# Patient Record
Sex: Male | Born: 1970 | Race: Black or African American | Hispanic: No | Marital: Married | Smoking: Never smoker
Health system: Southern US, Community
[De-identification: ages and names within clinical notes are randomized; demographics above are authoritative.]

## PROBLEM LIST (undated history)

## (undated) DIAGNOSIS — I10 Essential (primary) hypertension: Secondary | ICD-10-CM

---

## 2014-07-14 ENCOUNTER — Emergency Department (HOSPITAL_BASED_OUTPATIENT_CLINIC_OR_DEPARTMENT_OTHER)
Admission: EM | Admit: 2014-07-14 | Discharge: 2014-07-14 | Disposition: A | Discharge status: Home / Self Care | Payer: Worker's Compensation | Attending: Emergency Medicine | Admitting: Emergency Medicine

## 2014-07-14 ENCOUNTER — Emergency Department (HOSPITAL_BASED_OUTPATIENT_CLINIC_OR_DEPARTMENT_OTHER): Payer: Worker's Compensation

## 2014-07-14 ENCOUNTER — Encounter (HOSPITAL_BASED_OUTPATIENT_CLINIC_OR_DEPARTMENT_OTHER): Payer: Self-pay | Admitting: *Deleted

## 2014-07-14 DIAGNOSIS — Y990 Civilian activity done for income or pay: Secondary | ICD-10-CM | POA: Insufficient documentation

## 2014-07-14 DIAGNOSIS — L089 Local infection of the skin and subcutaneous tissue, unspecified: Secondary | ICD-10-CM | POA: Insufficient documentation

## 2014-07-14 DIAGNOSIS — Z79899 Other long term (current) drug therapy: Secondary | ICD-10-CM | POA: Insufficient documentation

## 2014-07-14 DIAGNOSIS — I10 Essential (primary) hypertension: Secondary | ICD-10-CM | POA: Diagnosis not present

## 2014-07-14 DIAGNOSIS — Y9289 Other specified places as the place of occurrence of the external cause: Secondary | ICD-10-CM | POA: Diagnosis not present

## 2014-07-14 DIAGNOSIS — S93402A Sprain of unspecified ligament of left ankle, initial encounter: Secondary | ICD-10-CM | POA: Diagnosis not present

## 2014-07-14 DIAGNOSIS — S91012A Laceration without foreign body, left ankle, initial encounter: Secondary | ICD-10-CM | POA: Insufficient documentation

## 2014-07-14 DIAGNOSIS — Y9389 Activity, other specified: Secondary | ICD-10-CM | POA: Insufficient documentation

## 2014-07-14 DIAGNOSIS — S99912A Unspecified injury of left ankle, initial encounter: Secondary | ICD-10-CM | POA: Diagnosis present

## 2014-07-14 DIAGNOSIS — W1842XA Slipping, tripping and stumbling without falling due to stepping into hole or opening, initial encounter: Secondary | ICD-10-CM | POA: Insufficient documentation

## 2014-07-14 DIAGNOSIS — S90512A Abrasion, left ankle, initial encounter: Secondary | ICD-10-CM | POA: Diagnosis not present

## 2014-07-14 HISTORY — DX: Essential (primary) hypertension: I10

## 2014-07-14 NOTE — ED Notes (Signed)
Patient transported to X-ray via wheelchair per tech. 

## 2014-07-14 NOTE — ED Notes (Signed)
Left ankle injury. He stepped into a hole at work. workmans comp.

## 2014-07-14 NOTE — ED Provider Notes (Signed)
CSN: [id number]     Arrival date & time 07/14/14  1946 History   First MD Initiated Contact with Patient 07/14/14 1959     Chief Complaint  Patient presents with  . Ankle Injury     (Consider location/radiation/quality/duration/timing/severity/associated sxs/prior Treatment) HPI Comments: Patient presents with chief complaint of left ankle injury which occurred approximately 3 PM today. Patient stepped into a crevice between the back of the truck and an unloading ramp. He twisted his ankle and sustained an abrasion through his sock. Patient was able to bear weight after the injury however with a limp. No treatments prior to arrival. Patient denies knee or hip injury. Onset acute. Course is constant. Pain is worse with walking. Nothing makes it better.  Patient is a 44 y.o. male presenting with lower extremity injury. The history is provided by the patient.  Ankle Injury Associated symptoms include arthralgias and joint swelling. Pertinent negatives include no neck pain, numbness or weakness.    Past Medical History  Diagnosis Date  . Hypertension    History reviewed. No pertinent past surgical history. No family history on file. History  Substance Use Topics  . Smoking status: Never Smoker   . Smokeless tobacco: Not on file  . Alcohol Use: No    Review of Systems  Constitutional: Negative for activity change.  Musculoskeletal: Positive for joint swelling, arthralgias and gait problem. Negative for back pain and neck pain.  Skin: Negative for wound.  Neurological: Negative for weakness and numbness.      Allergies  Review of patient's allergies indicates no known allergies.  Home Medications   Prior to Admission medications   Medication Sig Start Date End Date Taking? Authorizing Provider  Carvedilol (COREG PO) Take by mouth.   Yes Historical Provider, MD  LISINOPRIL PO Take by mouth.   Yes Historical Provider, MD   BP 148/97 mmHg  Pulse 90  Temp(Src) 98 F (36.7 C)  (Oral)  Resp 20  Ht 5\' 11"  (1.803 m)  Wt 270 lb (122.471 kg)  BMI 37.67 kg/m2  SpO2 99% Physical Exam  Constitutional: He appears well-developed and well-nourished.  HENT:  Head: Normocephalic and atraumatic.  Eyes: Conjunctivae are normal.  Neck: Normal range of motion. Neck supple.  Cardiovascular:  Pulses:      Dorsalis pedis pulses are 2+ on the right side, and 2+ on the left side.       Posterior tibial pulses are 2+ on the right side, and 2+ on the left side.  Pulmonary/Chest: No respiratory distress.  Musculoskeletal: He exhibits edema and tenderness.       Left hip: Normal.       Left knee: Normal.       Left ankle: He exhibits laceration (abrasion). He exhibits normal range of motion and no swelling. Tenderness. Lateral malleolus tenderness found. No posterior TFL, no head of 5th metatarsal and no proximal fibula tenderness found.       Left foot: Normal.  Patient complains of pain with palpation of the lateral left ankle. He denies pain with palpation over the fibular head of the affected side. He denies pain in the hip of the affected side.  Neurological: He is alert.  Distal motor, sensation, and vascular intact.  Skin: Skin is warm and dry.  1 cm circular, clean abrasion overlying the lateral malleolus of left ankle.  Psychiatric: He has a normal mood and affect.  Vitals reviewed.   ED Course  Procedures (including critical care time) Labs Review Labs  Reviewed - No data to display  Imaging Review Dg Ankle Complete Left  07/14/2014   CLINICAL DATA:  Acute left ankle pain after injury stepping on piece of rubber today. Initial encounter.  EXAM: LEFT ANKLE COMPLETE - 3+ VIEW  COMPARISON:  None.  FINDINGS: There is no evidence of fracture, dislocation, or joint effusion. There is no evidence of arthropathy or other focal bone abnormality. Soft tissue swelling is noted over lateral malleolus.  IMPRESSION: No fracture or dislocation is noted. Soft tissue swelling is seen  over lateral malleolus suggesting ligamentous injury.   Electronically Signed   By: Lupita Raider, M.D.   On: 07/14/2014 20:20     EKG Interpretation None       8:52 PM Patient seen and examined.   Vital signs reviewed and are as follows: BP 148/97 mmHg  Pulse 90  Temp(Src) 98 F (36.7 C) (Oral)  Resp 20  Ht  (1.803 m)  Wt 270 lb (122.471 kg)  BMI 37.67 kg/m2  SpO2 99%  Patient provided with wound care and ASO. Counseled on signs and symptoms of infection that should cause him to return. Counseled on rice protocol and NSAIDs. Patient will rest ankle the weekend. He is instructed to follow-up with a PCP if he continues to have pain or difficulty walking in one week.  MDM   Final diagnoses:  Ankle sprain, left, initial encounter  Abrasion of ankle with infection, left, initial encounter   Patient with ankle injury. Ankle is neurovascularly intact. X-rays negative. Conservative measures indicated with PCP follow-up as instructed.    Renne Crigler, PA-C 07/14/14 2119  Linwood Dibbles, MD 07/15/14 [phone]

## 2014-07-14 NOTE — Discharge Instructions (Signed)
Please read and follow all provided instructions.  Your diagnoses today include:  1. Ankle sprain, left, initial encounter   2. Abrasion of ankle with infection, left, initial encounter     Tests performed today include:  An x-ray of your ankle - does NOT show any broken bones  Vital signs. See below for your results today.   Medications prescribed:   Naproxen - anti-inflammatory pain medication  Do not exceed 500mg  naproxen every 12 hours, take with food  You have been prescribed an anti-inflammatory medication or NSAID. Take with food. Take smallest effective dose for the shortest duration needed for your pain. Stop taking if you experience stomach pain or vomiting.   Take any prescribed medications only as directed.  Home care instructions:   Follow any educational materials contained in this packet  Follow R.I.C.E. Protocol:  R - rest your injury   I  - use ice on injury without applying directly to skin  C - compress injury with bandage or splint  E - elevate the injury as much as possible  Follow-up instructions: Please follow-up with your primary care provider if you continue to have significant pain or trouble walking in 1 week. In this case you may have a severe sprain that requires further care.   Return instructions:   Please return if your toes are numb or tingling, appear gray or blue, or you have severe pain (also elevate leg and loosen splint or wrap)  Please return to the Emergency Department if you experience worsening symptoms.   Please return if you have any other emergent concerns.  Additional Information:  Your vital signs today were: BP 148/97 mmHg   Pulse 90   Temp(Src) 98 F (36.7 C) (Oral)   Resp 20   Ht 5\' 11"  (1.803 m)   Wt 270 lb (122.471 kg)   BMI 37.67 kg/m2   SpO2 99% If your blood pressure (BP) was elevated above 135/85 this visit, please have this repeated by your doctor within one month. -------------- Your caregiver has  diagnosed you as suffering from an ankle sprain. Ankle sprain occurs when the ligaments that hold the ankle joint together are stretched or torn. It may take 4 to 6 weeks to heal.  For Activity: If prescribed crutches, use crutches with non-weight bearing for the first few days. Then, you may walk on your ankle as the pain allows, or as instructed. Start gradually with weight bearing on the affected ankle. Once you can walk pain free, then try jogging. When you can run forwards, then you can try moving side-to-side. If you cannot walk without crutches in one week, you need a re-check. --------------

## 2014-07-17 ENCOUNTER — Emergency Department (HOSPITAL_BASED_OUTPATIENT_CLINIC_OR_DEPARTMENT_OTHER): Payer: Worker's Compensation

## 2014-07-17 ENCOUNTER — Emergency Department (HOSPITAL_BASED_OUTPATIENT_CLINIC_OR_DEPARTMENT_OTHER)
Admission: EM | Admit: 2014-07-17 | Discharge: 2014-07-18 | Disposition: A | Discharge status: Home / Self Care | Payer: Worker's Compensation | Attending: Emergency Medicine | Admitting: Emergency Medicine

## 2014-07-17 ENCOUNTER — Encounter (HOSPITAL_BASED_OUTPATIENT_CLINIC_OR_DEPARTMENT_OTHER): Payer: Self-pay | Admitting: *Deleted

## 2014-07-17 DIAGNOSIS — T148XXA Other injury of unspecified body region, initial encounter: Secondary | ICD-10-CM

## 2014-07-17 DIAGNOSIS — I10 Essential (primary) hypertension: Secondary | ICD-10-CM | POA: Insufficient documentation

## 2014-07-17 DIAGNOSIS — Z79899 Other long term (current) drug therapy: Secondary | ICD-10-CM | POA: Insufficient documentation

## 2014-07-17 DIAGNOSIS — Y9389 Activity, other specified: Secondary | ICD-10-CM | POA: Diagnosis not present

## 2014-07-17 DIAGNOSIS — S50312A Abrasion of left elbow, initial encounter: Secondary | ICD-10-CM | POA: Insufficient documentation

## 2014-07-17 DIAGNOSIS — W1789XA Other fall from one level to another, initial encounter: Secondary | ICD-10-CM | POA: Diagnosis not present

## 2014-07-17 DIAGNOSIS — Y9289 Other specified places as the place of occurrence of the external cause: Secondary | ICD-10-CM | POA: Diagnosis not present

## 2014-07-17 DIAGNOSIS — Y990 Civilian activity done for income or pay: Secondary | ICD-10-CM | POA: Insufficient documentation

## 2014-07-17 DIAGNOSIS — S50812A Abrasion of left forearm, initial encounter: Secondary | ICD-10-CM | POA: Diagnosis not present

## 2014-07-17 DIAGNOSIS — S59912A Unspecified injury of left forearm, initial encounter: Secondary | ICD-10-CM | POA: Diagnosis present

## 2014-07-17 MED ORDER — IBUPROFEN 800 MG PO TABS
800.0000 mg | ORAL_TABLET | Freq: Once | ORAL | Status: AC
  Administered 2014-07-17: 800 mg via ORAL
  Filled 2014-07-17: qty 1

## 2014-07-17 NOTE — ED Notes (Signed)
Larey Seat 4' off the back of a truck at work. workmans comp. Injury to his left lower leg and left forearm/elbow. Abrasion to his arm. Bleeding controlled.

## 2014-07-17 NOTE — ED Provider Notes (Signed)
CSN: [id number]     Arrival date & time 07/17/14  2010 History  This chart was scribed for Syrus Nakama, MD by Octavia Heir, ED Scribe. This patient was seen in room MH07/MH07 and the patient's care was started at 11:42 PM.    Chief Complaint  Patient presents with  . Fall      Patient is a 44 y.o. male presenting with fall. The history is provided by the patient. No language interpreter was used.  Fall This is a new problem. The current episode started 6 to 12 hours ago. The problem occurs constantly. The problem has not changed since onset.Pertinent negatives include no chest pain, no abdominal pain, no headaches and no shortness of breath. Nothing aggravates the symptoms. Nothing relieves the symptoms. He has tried nothing for the symptoms.    HPI Comments: Nathan Travis is a 44 y.o. male who presents to the Emergency Department complaining of a fall that occurred about 7 hours ago. Pt notes having  left forearm and elbow. Pt denies LOC and head injury. No weakness or numbness continued to work.  FROM  Past Medical History  Diagnosis Date  . Hypertension    History reviewed. No pertinent past surgical history. No family history on file. History  Substance Use Topics  . Smoking status: Never Smoker   . Smokeless tobacco: Not on file  . Alcohol Use: No    Review of Systems  Respiratory: Negative for shortness of breath.   Cardiovascular: Negative for chest pain.  Gastrointestinal: Negative for abdominal pain.  Musculoskeletal: Positive for arthralgias. Negative for back pain, neck pain and neck stiffness.  Neurological: Negative for weakness, light-headedness, numbness and headaches.  All other systems reviewed and are negative.     Allergies  Review of patient's allergies indicates no known allergies.  Home Medications   Prior to Admission medications   Medication Sig Start Date End Date Taking? Authorizing Provider  Carvedilol (COREG PO) Take by mouth.     Historical Provider, MD  LISINOPRIL PO Take by mouth.    Historical Provider, MD    Triage vitals: BP 144/95 mmHg  Pulse 65  Temp(Src) 98 F (36.7 C) (Oral)  Resp 18  Ht  (1.803 m)  Wt 275 lb (124.739 kg)  BMI 38.37 kg/m2  SpO2 100% Physical Exam  Constitutional: He is oriented to person, place, and time. He appears well-developed and well-nourished. No distress.  HENT:  Head: Normocephalic and atraumatic. Head is without raccoon's eyes and without Battle's sign.  Right Ear: No mastoid tenderness. No hemotympanum.  Left Ear: No mastoid tenderness. No hemotympanum.  Mouth/Throat: Oropharynx is clear and moist.   No crepitus or step offs or t endernessof c, t or l spine   Eyes: Conjunctivae and EOM are normal. Pupils are equal, round, and reactive to light. No scleral icterus.  Neck: Normal range of motion. Neck supple. No tracheal deviation present. No thyromegaly present.  Cardiovascular: Normal rate and regular rhythm.  Exam reveals no gallop and no friction rub.   No murmur heard. Pulmonary/Chest: Effort normal and breath sounds normal. No respiratory distress. He has no wheezes. He has no rales.  Abdominal: Soft. Bowel sounds are normal. He exhibits no distension. There is no tenderness. There is no rebound and no guarding.  Musculoskeletal: Normal range of motion.       Left shoulder: Normal. He exhibits normal range of motion, no tenderness, no bony tenderness, no swelling, no effusion, no crepitus, no deformity, no laceration,  no pain, no spasm, normal pulse and normal strength.       Left elbow: He exhibits normal range of motion, no swelling, no effusion, no deformity and no laceration. No tenderness found. No radial head, no medial epicondyle and no lateral epicondyle tenderness noted.       Left wrist: Normal.       Left upper arm: He exhibits no tenderness, no bony tenderness, no swelling, no edema, no deformity and no laceration.       Arms:      Left hand:  Normal. Normal sensation noted. Normal strength noted.  2+ radial pulse cap refill < 2 sec  Lymphadenopathy:    He has no cervical adenopathy.  Neurological: He is alert and oriented to person, place, and time. He has normal reflexes.  Biceps tendon intact, triceps tendon in tact Cap refill of fingers less than 2 sec and neurovascular intact Neers intact  Skin: Skin is warm and dry. No rash noted.  Abrasion on dorsal surface of left forearm   Psychiatric: He has a normal mood and affect. His behavior is normal.    ED Course  Procedures  DIAGNOSTIC STUDIES: Oxygen Saturation is 100% on RA, normal by my interpretation.  COORDINATION OF CARE:  11:47 PM Discussed treatment plan which includes wound care, ibuprofen with pt at bedside and pt agreed to plan.  Labs Review Labs Reviewed - No data to display  Imaging Review No results found.   EKG Interpretation None      MDM   Final diagnoses:  None   Informed of old injury to the antecubital fossa. Has no pain there and FROM.  Ice and NSAIDs and elevation and close follow up  I personally performed the services described in this documentation, which was scribed in my presence. The recorded information has been reviewed and is accurate.    Cy Blamer, MD 07/18/14 [phone]

## 2014-07-17 NOTE — ED Notes (Signed)
Larey Seat off back truck this pm,  C/o pain to left forearm and elbow, w abrasions

## 2014-07-18 ENCOUNTER — Encounter (HOSPITAL_BASED_OUTPATIENT_CLINIC_OR_DEPARTMENT_OTHER): Payer: Self-pay | Admitting: Emergency Medicine

## 2014-07-18 MED ORDER — IBUPROFEN 800 MG PO TABS: 800.0000 mg | ORAL_TABLET | Freq: Three times a day (TID) | ORAL | Status: AC

## 2014-07-18 NOTE — Discharge Instructions (Signed)

## 2014-07-20 ENCOUNTER — Other Ambulatory Visit: Payer: Self-pay | Admitting: Occupational Medicine

## 2014-07-20 ENCOUNTER — Ambulatory Visit: Payer: Self-pay

## 2014-07-20 DIAGNOSIS — M549 Dorsalgia, unspecified: Secondary | ICD-10-CM

## 2016-04-26 IMAGING — DX DG FOREARM 2V*L*
2 series · 2 of 2 positions shown · non-contrast
Comparison: None.

CLINICAL DATA: Status post fall, with left forearm pain. Initial
encounter.

EXAM:
LEFT FOREARM - 2 VIEW

[forearm ap]
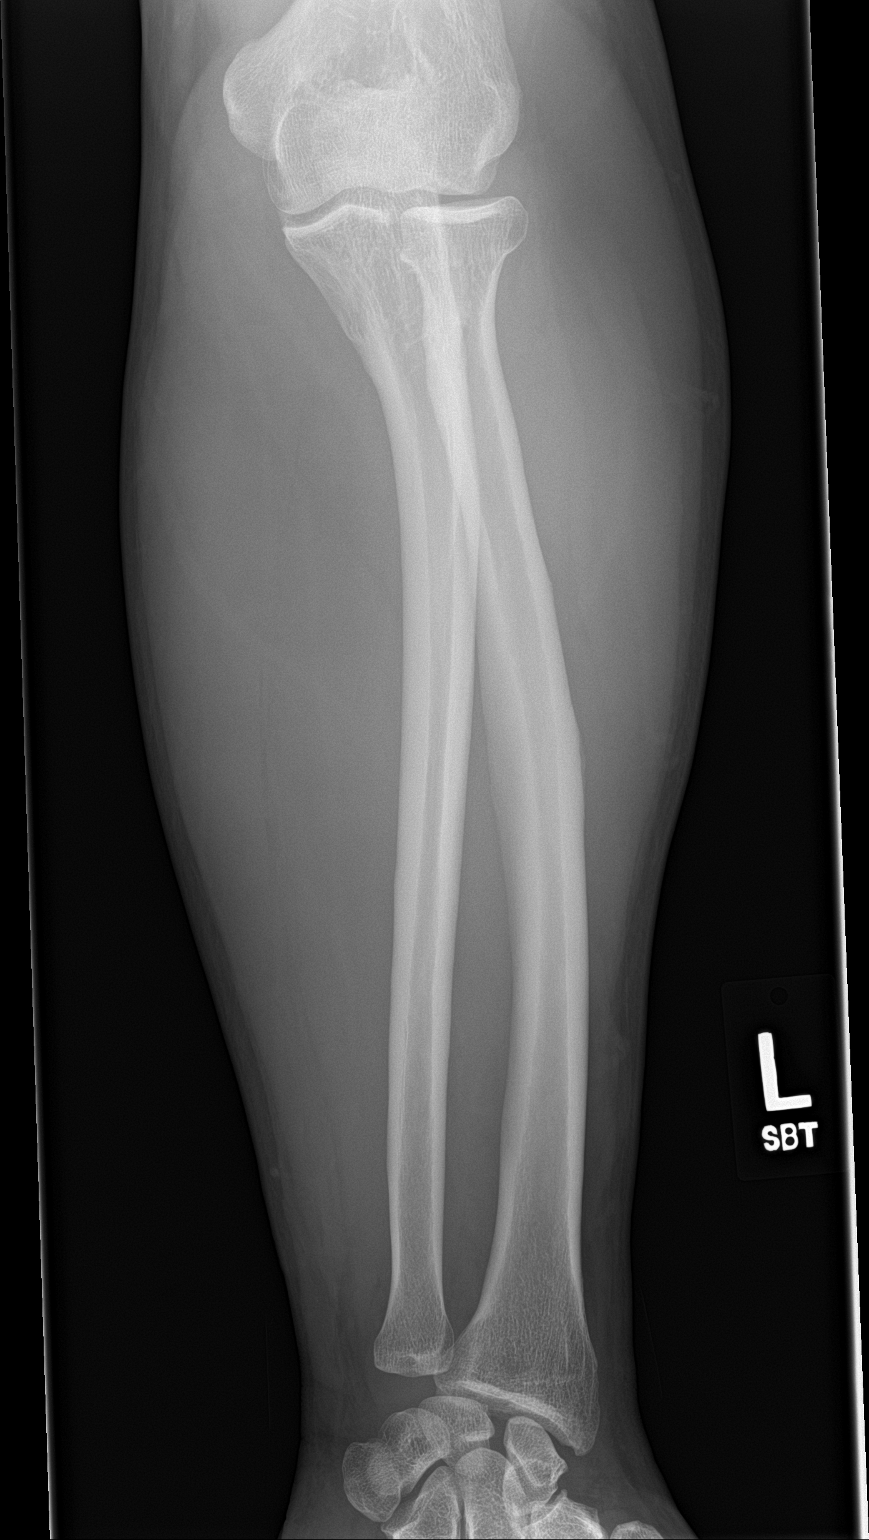

[forearm lat]
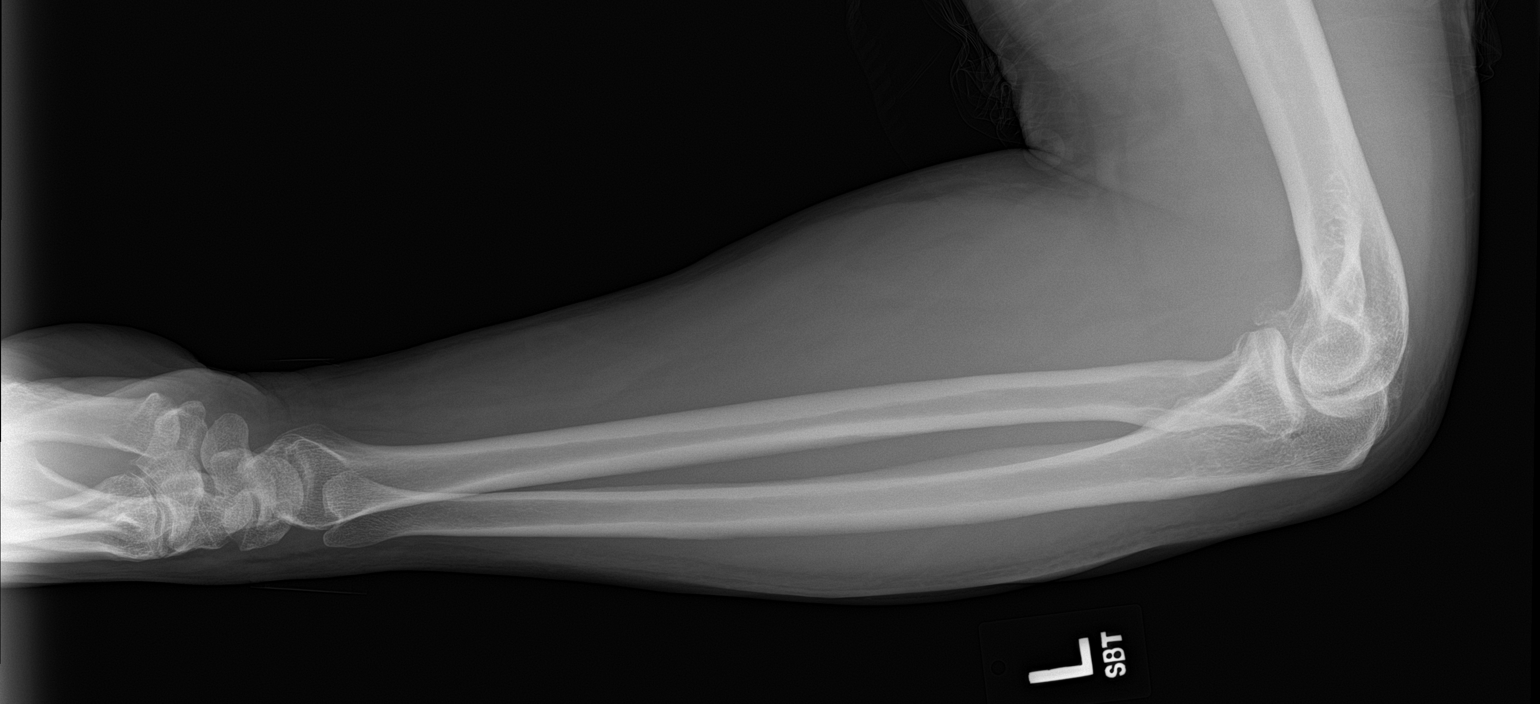

[2 of 2 positions shown; findings below may reference images not displayed]

FINDINGS: There is no evidence of fracture or dislocation. The radius and ulna
appear intact. The rounded osseous fragment at the antecubital fossa
likely reflects remote injury. No elbow joint effusion is seen. The
carpal rows are grossly unremarkable in appearance, and demonstrate
normal alignment. Mild dorsal soft tissue swelling is suggested
along the forearm.
IMPRESSION: No evidence of fracture or dislocation.

## 2016-04-26 IMAGING — DX DG ELBOW COMPLETE 3+V*L*
4 series · 4 of 4 positions shown · non-contrast
Comparison: None.

CLINICAL DATA: Fell off truck 7 hours ago, with injury to left
elbow. Initial encounter.

EXAM:
LEFT ELBOW - COMPLETE 3+ VIEW

[elbow ap]
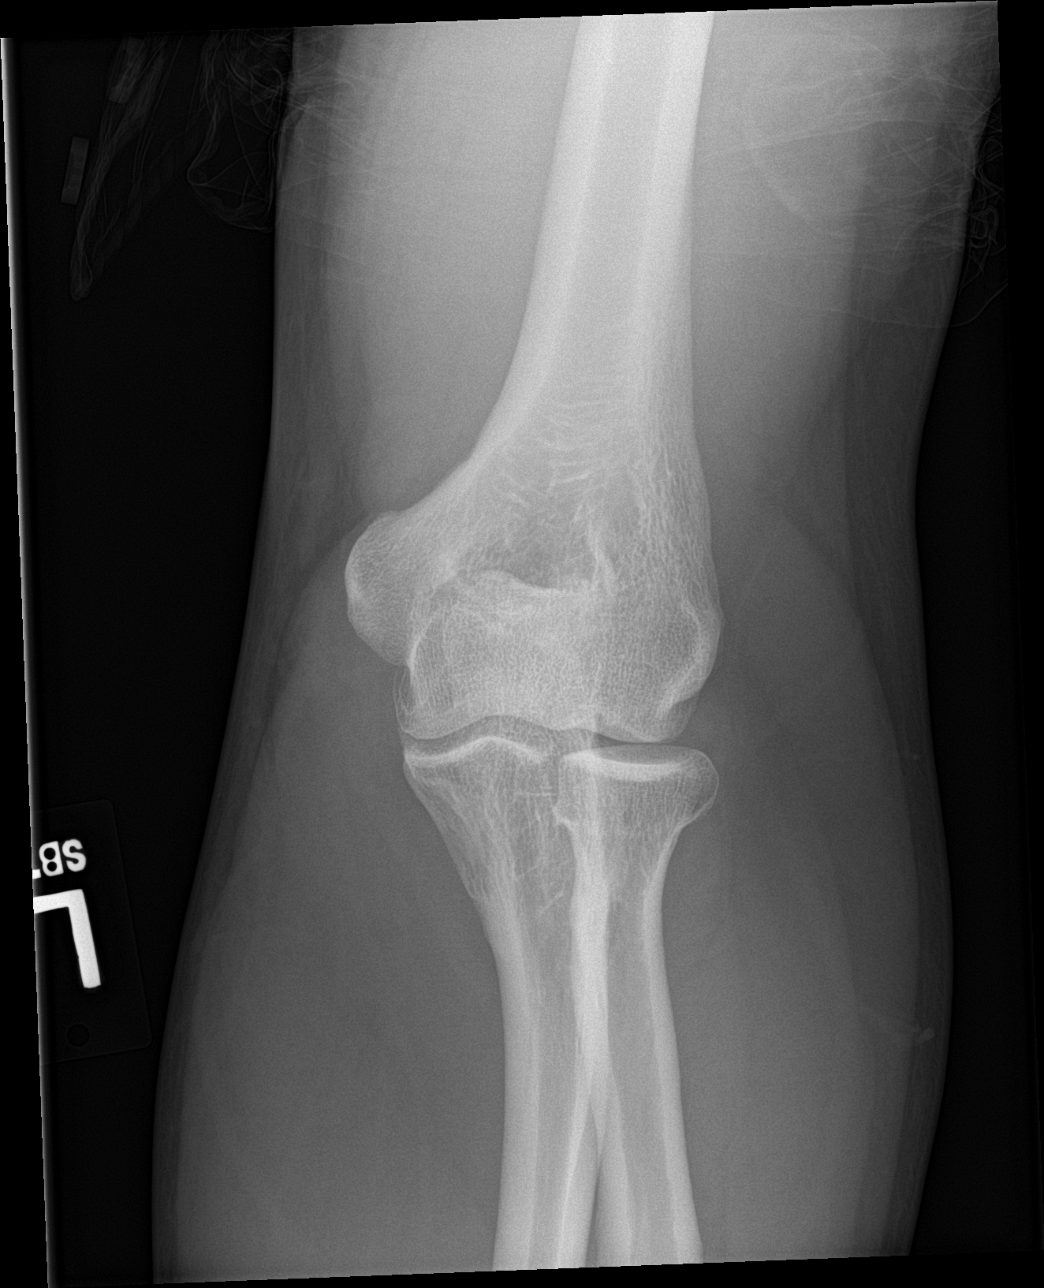

[elbow obl (1 of 2)]
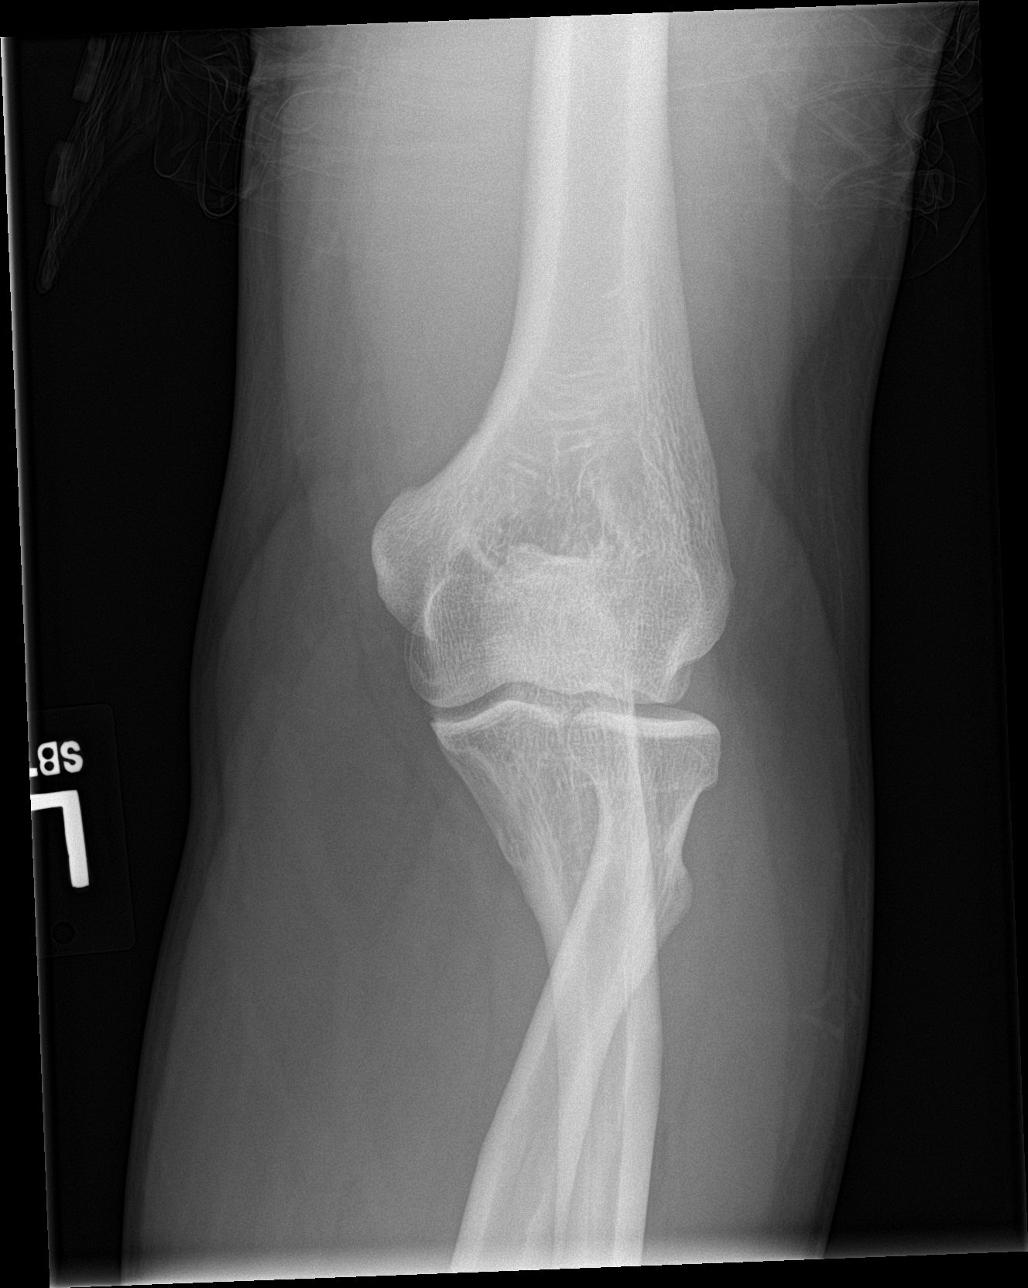

[elbow obl (2 of 2)]
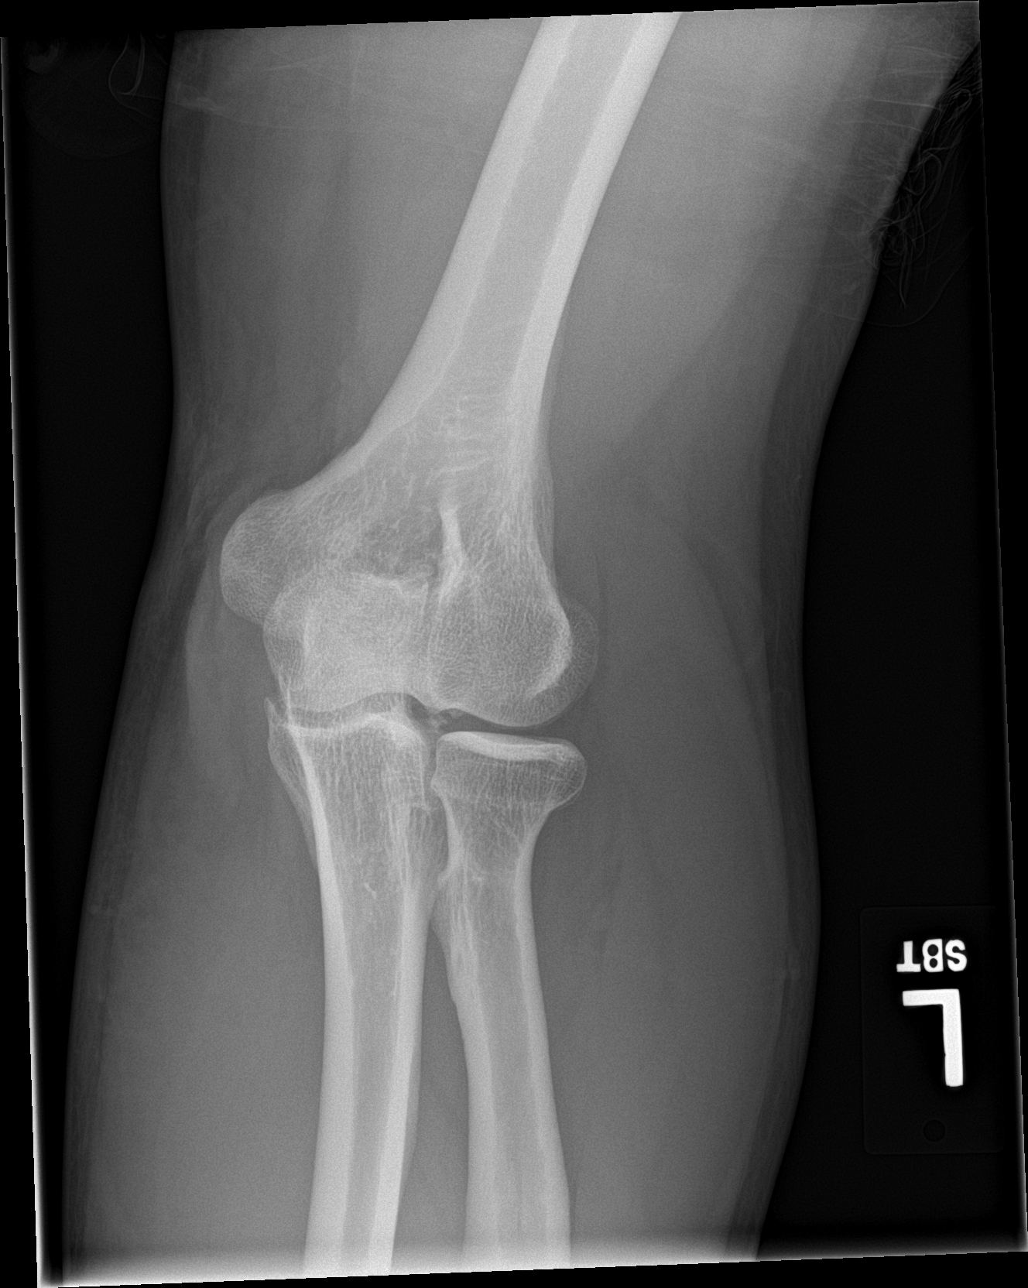

[elbow lat]
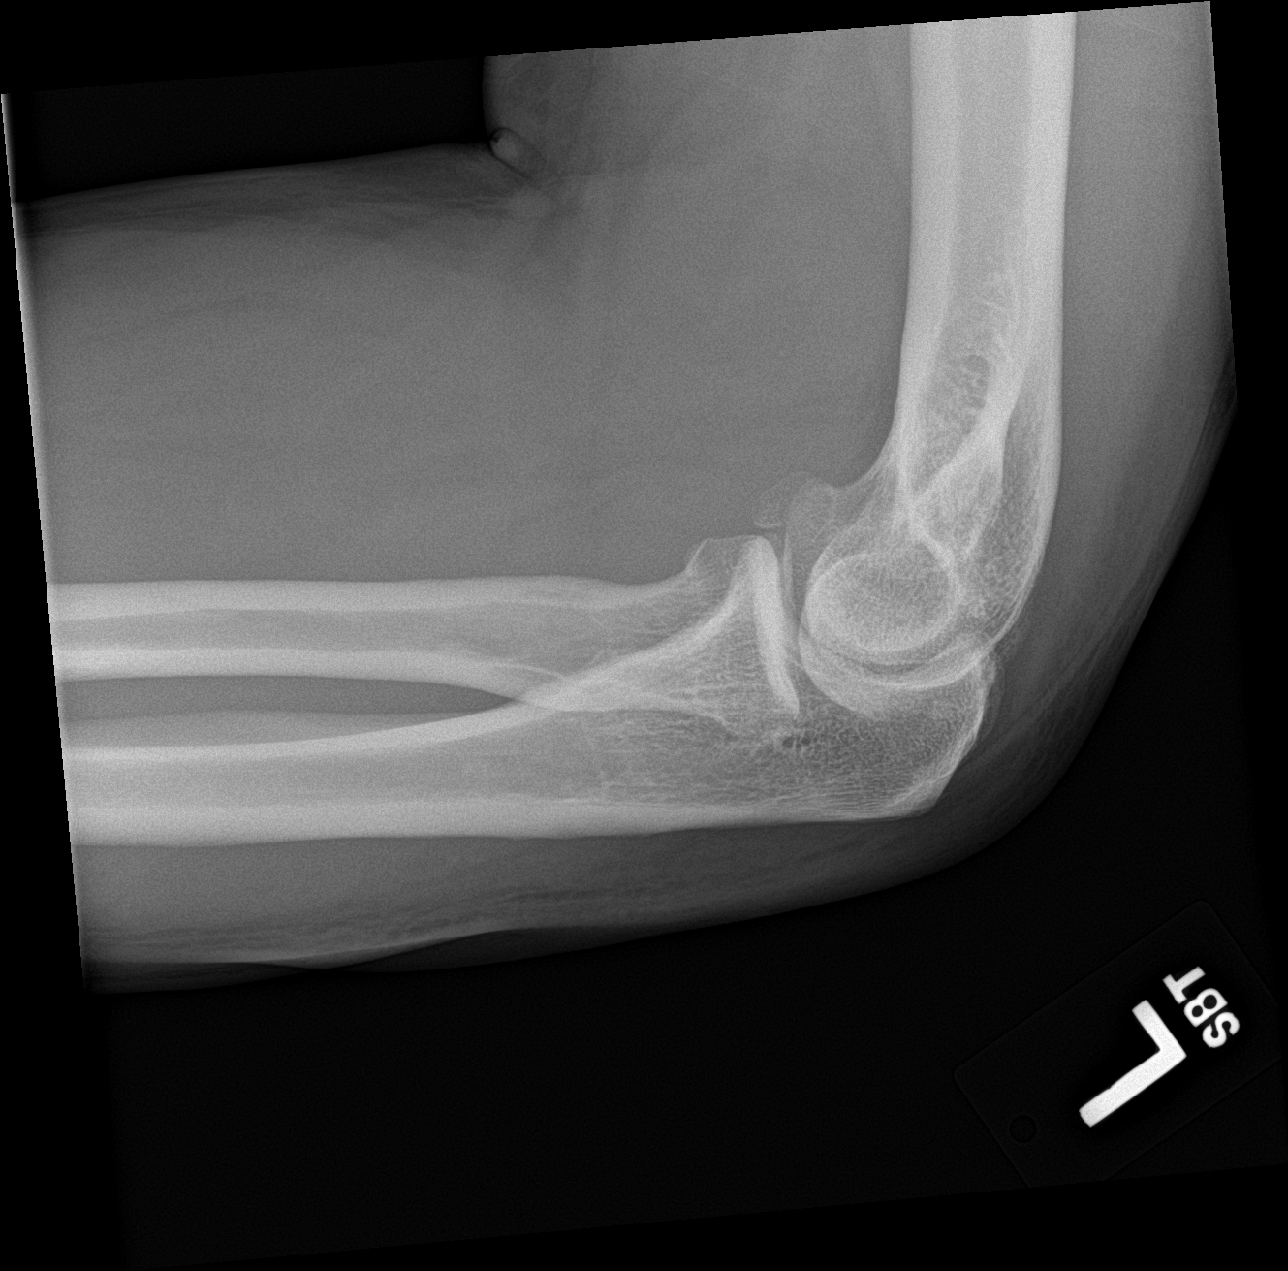

[4 of 4 positions shown; findings below may reference images not displayed]

FINDINGS: There is no evidence of fracture or dislocation. An apparent rounded
osseous fragment at the antecubital fossa likely reflects remote
injury. The visualized joint spaces are preserved. No significant
joint effusion is identified. The soft tissues are unremarkable in
appearance.
IMPRESSION: No evidence of fracture or dislocation. Apparent rounded osseous
fragment at the antecubital fossa likely reflects remote injury. No
significant joint effusion seen.

## 2022-11-17 ENCOUNTER — Encounter (INDEPENDENT_AMBULATORY_CARE_PROVIDER_SITE_OTHER): Payer: Self-pay | Admitting: *Deleted

## 2023-05-18 ENCOUNTER — Encounter (INDEPENDENT_AMBULATORY_CARE_PROVIDER_SITE_OTHER): Payer: Self-pay | Admitting: *Deleted
